# Patient Record
Sex: Female | Born: 1975 | Race: Black or African American | Hispanic: No | Marital: Single | Smoking: Never smoker
Health system: Southern US, Community
[De-identification: ages and names within clinical notes are randomized; demographics above are authoritative.]

## PROBLEM LIST (undated history)

## (undated) DIAGNOSIS — F419 Anxiety disorder, unspecified: Secondary | ICD-10-CM

## (undated) DIAGNOSIS — I10 Essential (primary) hypertension: Secondary | ICD-10-CM

## (undated) DIAGNOSIS — D649 Anemia, unspecified: Secondary | ICD-10-CM

## (undated) DIAGNOSIS — E785 Hyperlipidemia, unspecified: Secondary | ICD-10-CM

## (undated) DIAGNOSIS — K219 Gastro-esophageal reflux disease without esophagitis: Secondary | ICD-10-CM

## (undated) DIAGNOSIS — N920 Excessive and frequent menstruation with regular cycle: Secondary | ICD-10-CM

## (undated) HISTORY — DX: Anemia, unspecified: D64.9

## (undated) HISTORY — DX: Hyperlipidemia, unspecified: E78.5

## (undated) HISTORY — DX: Essential (primary) hypertension: I10

## (undated) HISTORY — DX: Excessive and frequent menstruation with regular cycle: N92.0

## (undated) HISTORY — DX: Anxiety disorder, unspecified: F41.9

## (undated) HISTORY — DX: Gastro-esophageal reflux disease without esophagitis: K21.9

---

## 2011-02-10 ENCOUNTER — Other Ambulatory Visit (HOSPITAL_COMMUNITY)
Admission: RE | Admit: 2011-02-10 | Discharge: 2011-02-10 | Disposition: A | Discharge status: Home / Self Care | Payer: Self-pay | Source: Ambulatory Visit | Attending: Obstetrics and Gynecology | Admitting: Obstetrics and Gynecology

## 2011-02-10 ENCOUNTER — Encounter: Payer: Self-pay | Admitting: Obstetrics and Gynecology

## 2011-02-10 ENCOUNTER — Ambulatory Visit (INDEPENDENT_AMBULATORY_CARE_PROVIDER_SITE_OTHER): Payer: Self-pay | Admitting: Obstetrics and Gynecology

## 2011-02-10 VITALS — BP 103/67 | HR 91 | Temp 99.9°F | Ht 67.0 in | Wt 255.2 lb

## 2011-02-10 DIAGNOSIS — N924 Excessive bleeding in the premenopausal period: Secondary | ICD-10-CM

## 2011-02-10 DIAGNOSIS — N920 Excessive and frequent menstruation with regular cycle: Secondary | ICD-10-CM | POA: Insufficient documentation

## 2011-02-10 MED ORDER — IBUPROFEN 200 MG PO TABS
800.0000 mg | ORAL_TABLET | Freq: Once | ORAL | Status: AC
  Administered 2011-02-10: 800 mg via ORAL

## 2011-02-10 NOTE — Patient Instructions (Signed)
Menorrhagia, Heavy Periods Dysfunctional uterine bleeding is different from a normal menstrual period. When periods are heavy or there is more bleeding than is usual for you, it is called menorrhagia. It may be caused by hormonal imbalance, or physical, metabolic, or other problems. Examination is necessary in order that your caregiver may treat treatable causes. If this is a continuing problem, a D&C may be needed. That means that the cervix (the opening of the uterus or womb) is dilated (stretched larger) and the lining of the uterus is scraped out. The tissue scraped out is then examined under a microscope by a specialist (pathologist) to make sure there is nothing of concern that needs further or more extensive treatment. HOME CARE INSTRUCTIONS  If medications were prescribed, take exactly as directed. Do not change or switch medications without consulting your caregiver.   Long term heavy bleeding may result in iron deficiency. Your caregiver may have prescribed iron pills. They help replace the iron your body lost from heavy bleeding. Take exactly as directed. Iron may cause constipation. If this becomes a problem, increase the bran, fruits, and roughage in your diet.   Do not take aspirin or medicines that contain aspirin one week before or during your menstrual period. Aspirin may make the bleeding worse.   If you need to change your sanitary pad or tampon more than once every 2 hours, stay in bed and rest as much as possible until the bleeding stops.   Eat well-balanced meals. Eat foods high in iron. Examples are leafy green vegetables, meat, liver, eggs, and whole grain breads and cereals. Do not try to lose weight until the abnormal bleeding has stopped and your blood iron level is back to normal.  SEEK MEDICAL CARE IF:  You need to change your sanitary pad or tampon more than once an hour.   You develop nausea (feeling sick to your stomach) and vomiting, dizziness, or diarrhea while you  are taking your medicine.   You have any problems that may be related to the medicine you are taking.  SEEK IMMEDIATE MEDICAL CARE IF:  An oral temperature above 100 develops.   You develop chills.   You develop severe bleeding or start to pass blood clots.   You feel dizzy or faint.  MAKE SURE YOU:   Understand these instructions.   Will watch your condition.   Will get help right away if you are not doing well or get worse.  Document Released: 06/19/2005 Document Re-Released: 06/01/2008 [facility] Patient Information 2011 [place], [place].

## 2011-02-10 NOTE — Progress Notes (Signed)
C/o of spotting July 9 until  17th when period started, this month started spotting 02/01/11 until present

## 2011-03-15 ENCOUNTER — Other Ambulatory Visit: Payer: Self-pay | Admitting: Obstetrics and Gynecology

## 2011-03-15 ENCOUNTER — Encounter: Payer: Self-pay | Admitting: Obstetrics and Gynecology

## 2011-03-15 ENCOUNTER — Ambulatory Visit (INDEPENDENT_AMBULATORY_CARE_PROVIDER_SITE_OTHER): Payer: Self-pay | Admitting: Obstetrics and Gynecology

## 2011-03-15 VITALS — BP 113/71 | HR 79 | Temp 98.4°F | Resp 20 | Ht 67.0 in | Wt 257.7 lb

## 2011-03-15 DIAGNOSIS — D649 Anemia, unspecified: Secondary | ICD-10-CM

## 2011-03-15 DIAGNOSIS — N938 Other specified abnormal uterine and vaginal bleeding: Secondary | ICD-10-CM

## 2011-03-15 DIAGNOSIS — N949 Unspecified condition associated with female genital organs and menstrual cycle: Secondary | ICD-10-CM

## 2011-03-15 MED ORDER — MEDROXYPROGESTERONE ACETATE 150 MG/ML IM SUSP
150.0000 mg | Freq: Once | INTRAMUSCULAR | Status: AC
  Administered 2011-03-15: 150 mg via INTRAMUSCULAR

## 2011-03-15 MED ORDER — MEDROXYPROGESTERONE ACETATE 150 MG/ML IM SUSP: 150.0000 mg | Freq: Once | INTRAMUSCULAR | Status: DC

## 2011-03-15 NOTE — Progress Notes (Signed)
Addended by: Faythe Casa on: 03/15/2011 04:28 PM   Modules accepted: Orders

## 2011-03-15 NOTE — Progress Notes (Signed)
Patient is a 35 year old African American female who is a diabetic and has hypertension for many years she's had very heavy periods and was recently seen here in had an endometrial biopsy which was normal. She was told by her family doctor for her anemia was close to needing transfusion. She was previously on oral contraceptives but spotted all the time and therefore stopped them. The patient is nulligravida and is not sure whether she never wants any children.  Plan: We've discussed her problem and decided since she's already taken iron that we will give her one dose of Depo-Provera in order to control her periods immediately. We'll have her fill out the papers for a [place] IUD, and also give her information about endometrial ablation. Hopefully the Depo-Provera work. Then will bring her back in 2 months to make a decision on whether to schedule her for an endometrial ablation or insert a [place] IUD. We'll also obtain a CBC today is that was not done her last visit and she will were starting from with her anemia. She seems to except the plan quite well. I also on a get in ultrasound of the pelvis to make sure there is no anatomical reason for the bleeding as this was not well established because of her size during the last visit.

## 2011-03-16 LAB — CBC WITH DIFFERENTIAL/PLATELET
Basophils Absolute: 0 10*3/uL (ref 0.0–0.1)
Basophils Relative: 0 % (ref 0–1)
Eosinophils Absolute: 0.1 10*3/uL (ref 0.0–0.7)
Eosinophils Relative: 1 % (ref 0–5)
MCH: 23.2 pg — ABNORMAL LOW (ref 26.0–34.0)
MCV: 77.9 fL — ABNORMAL LOW (ref 78.0–100.0)
Neutrophils Relative %: 49 % (ref 43–77)
Platelets: 493 10*3/uL — ABNORMAL HIGH (ref 150–400)
RDW: 18.1 % — ABNORMAL HIGH (ref 11.5–15.5)

## 2011-03-21 ENCOUNTER — Ambulatory Visit (HOSPITAL_COMMUNITY)
Admission: RE | Admit: 2011-03-21 | Discharge: 2011-03-21 | Disposition: A | Discharge status: Home / Self Care | Payer: Self-pay | Source: Ambulatory Visit | Attending: Obstetrics and Gynecology | Admitting: Obstetrics and Gynecology

## 2011-03-21 DIAGNOSIS — D250 Submucous leiomyoma of uterus: Secondary | ICD-10-CM | POA: Insufficient documentation

## 2011-03-21 DIAGNOSIS — D251 Intramural leiomyoma of uterus: Secondary | ICD-10-CM | POA: Insufficient documentation

## 2011-03-21 DIAGNOSIS — D649 Anemia, unspecified: Secondary | ICD-10-CM

## 2011-03-21 DIAGNOSIS — N938 Other specified abnormal uterine and vaginal bleeding: Secondary | ICD-10-CM | POA: Insufficient documentation

## 2011-03-21 DIAGNOSIS — N949 Unspecified condition associated with female genital organs and menstrual cycle: Secondary | ICD-10-CM | POA: Insufficient documentation

## 2011-03-27 ENCOUNTER — Encounter: Payer: Self-pay | Admitting: Obstetrics and Gynecology

## 2011-03-27 ENCOUNTER — Encounter: Payer: Self-pay | Admitting: Unknown Physician Specialty

## 2011-04-26 ENCOUNTER — Encounter: Payer: Self-pay | Admitting: Advanced Practice Midwife

## 2011-04-26 ENCOUNTER — Ambulatory Visit (INDEPENDENT_AMBULATORY_CARE_PROVIDER_SITE_OTHER): Payer: Self-pay | Admitting: Advanced Practice Midwife

## 2011-04-26 VITALS — BP 106/74 | HR 86 | Temp 98.2°F | Resp 20 | Ht 67.0 in | Wt 253.2 lb

## 2011-04-26 DIAGNOSIS — D259 Leiomyoma of uterus, unspecified: Secondary | ICD-10-CM

## 2011-04-26 DIAGNOSIS — D250 Submucous leiomyoma of uterus: Secondary | ICD-10-CM

## 2011-04-26 DIAGNOSIS — N920 Excessive and frequent menstruation with regular cycle: Secondary | ICD-10-CM

## 2011-04-26 NOTE — Patient Instructions (Signed)
Fibroids You have been diagnosed as having a fibroid. Fibroids are smooth muscle lumps (tumors) which can occur any place in a woman's body. They are usually in the womb (uterus). The most common problem (symptom) of fibroids is bleeding. Over time this may cause low red blood cells (anemia). Other symptoms include feelings of pressure and pain in the pelvis. The diagnosis (learning what is wrong) of fibroids is made by physical exam. Sometimes tests such as an ultrasound are used. This is helpful when fibroids are felt around the ovaries and to look for tumors. TREATMENT   Most fibroids do not need surgical or medical treatment. Sometimes a tissue sample (biopsy) of the lining of the uterus is done to rule out cancer. If there is no cancer and only a small amount of bleeding, the problem can be watched.   Hormonal treatment can improve the problem.   When surgery is needed, it can consist of removing the fibroid. Vaginal birth may not be possible after the removal of fibroids. This depends on where they are and the extent of surgery. When pregnancy occurs with fibroids it is usually normal.   Your caregiver can help decide which treatments are best for you.  HOME CARE INSTRUCTIONS   Do not use aspirin as this may increase bleeding problems.   If your periods (menses) are heavy, record the number of pads or tampons used per month. Bring this information to your caregiver. This can help them determine the best treatment for you.  SEEK IMMEDIATE MEDICAL CARE IF:  You have pelvic pain or cramps not controlled with medications, or experience a sudden increase in pain.   You have an increase of pelvic bleeding between and during menses.   You feel lightheaded or have fainting spells.   You develop worsening belly (abdominal) pain.  Document Released: 06/16/2000 Document Revised: 03/01/2011 Document Reviewed: 02/06/2008 [facility] Patient Information 2012 [place], [place].Menorrhagia Dysfunctional  uterine bleeding is different from a normal menstrual period. When periods are heavy or there is more bleeding than is usual for you, it is called menorrhagia. It may be caused by hormonal imbalance, or physical, metabolic, or other problems. Examination is necessary in order that your caregiver may treat treatable causes. If this is a continuing problem, a D&C may be needed. That means that the cervix (the opening of the uterus or womb) is dilated (stretched larger) and the lining of the uterus is scraped out. The tissue scraped out is then examined under a microscope by a specialist (pathologist) to make sure there is nothing of concern that needs further or more extensive treatment. HOME CARE INSTRUCTIONS   If medications were prescribed, take exactly as directed. Do not change or switch medications without consulting your caregiver.   Long term heavy bleeding may result in iron deficiency. Your caregiver may have prescribed iron pills. They help replace the iron your body lost from heavy bleeding. Take exactly as directed. Iron may cause constipation. If this becomes a problem, increase the bran, fruits, and roughage in your diet.   Do not take aspirin or medicines that contain aspirin one week before or during your menstrual period. Aspirin may make the bleeding worse.   If you need to change your sanitary pad or tampon more than once every 2 hours, stay in bed and rest as much as possible until the bleeding stops.   Eat well-balanced meals. Eat foods high in iron. Examples are leafy green vegetables, meat, liver, eggs, and whole grain breads and  cereals. Do not try to lose weight until the abnormal bleeding has stopped and your blood iron level is back to normal.  SEEK MEDICAL CARE IF:   You need to change your sanitary pad or tampon more than once an hour.   You develop nausea (feeling sick to your stomach) and vomiting, dizziness, or diarrhea while you are taking your medicine.   You have  any problems that may be related to the medicine you are taking.  SEEK IMMEDIATE MEDICAL CARE IF:   You have a fever.   You develop chills.   You develop severe bleeding or start to pass blood clots.   You feel dizzy or faint.  MAKE SURE YOU:   Understand these instructions.   Will watch your condition.   Will get help right away if you are not doing well or get worse.  Document Released: 06/19/2005 Document Revised: 03/01/2011 Document Reviewed: 02/07/2008 [facility] Patient Information 2012 [place], [place].

## 2011-04-26 NOTE — Progress Notes (Signed)
  Subjective:    Patient ID: Bridget Hunt, female    DOB: 10-04-1975, 35 y.o.   MRN: [id number]  HPI 35 yo G0 P0 who was seen by Dr Okey Dupre last month for a long history of heavy menstrual bleeding. She was started on DepoProvera to help with that while having an [place] and CBC done. Discussion was had regarding possible treatments including ablation and Mirena IUD.   She returns today for results.  Sisters have all had fibroids and one has had ablation, others had hysterectomy.  May want to have children at some point.  Review of Systems As above     Objective:   Physical Exam Deferred per patient and recent exam.   Hgb 8.7 US showed multiple uterine fibroids, largest at 2cm, some intramural. One is partially intramural and submucosal. Ovaries normal . Endometrium 3mm.     Assessment & Plan:  A:  Menorrhagia secondary to uterine fibroids  P:  Interested in [place] IUD.  Pamphlet given.  Long discussion of possible treatments with implications for childbearing and reversability. Pt wants to start with Mirena and move on if if does not work.  Will fill out application for Mirena assistance.

## 2011-12-06 ENCOUNTER — Encounter: Payer: Self-pay | Admitting: Obstetrics & Gynecology

## 2011-12-27 ENCOUNTER — Ambulatory Visit (INDEPENDENT_AMBULATORY_CARE_PROVIDER_SITE_OTHER): Payer: Self-pay | Admitting: Physician Assistant

## 2011-12-27 ENCOUNTER — Encounter: Payer: Self-pay | Admitting: Physician Assistant

## 2011-12-27 ENCOUNTER — Other Ambulatory Visit (HOSPITAL_COMMUNITY)
Admission: RE | Admit: 2011-12-27 | Discharge: 2011-12-27 | Disposition: A | Discharge status: Home / Self Care | Payer: Self-pay | Source: Ambulatory Visit | Attending: Physician Assistant | Admitting: Physician Assistant

## 2011-12-27 VITALS — BP 120/80 | HR 88 | Temp 97.2°F | Ht 66.0 in | Wt 261.6 lb

## 2011-12-27 DIAGNOSIS — R87612 Low grade squamous intraepithelial lesion on cytologic smear of cervix (LGSIL): Secondary | ICD-10-CM

## 2011-12-27 DIAGNOSIS — N870 Mild cervical dysplasia: Secondary | ICD-10-CM | POA: Insufficient documentation

## 2011-12-27 DIAGNOSIS — IMO0002 Reserved for concepts with insufficient information to code with codable children: Secondary | ICD-10-CM

## 2011-12-27 NOTE — Patient Instructions (Signed)
Colposcopy Colposcopy is a procedure that uses a special lighted microscope (colposcope). It examines your cervix and vagina, or the area around the outside of the vagina, for signs of disease or abnormalities in the cells. You may be sent to a specialist (gynecologist) to do the colposcopy. A biopsy (tissue sample) may be collected during a colposcopy, if the caregiver finds any unusual cells. The biopsy is sent to the lab for further testing, and the results are reported back to your caregiver. A WOMAN MAY NEED THIS PROCEDURE IF:  She has had an abnormal pap smear (taking cells from the cervix for testing).   She has a sore on her cervix, and a Pap test was normal.   The Pap test suggests human papilloma virus (HPV). This virus can cause genital warts and is linked to the development of cervical cancer.   She has genital warts on the cervix, or in or around the outside of the vagina.   Her mother took the drug DES while pregnant.   She has painful intercourse.   She has vaginal bleeding, especially after sexual intercourse.   There is a need to evaluate the results of previous treatment.  BEFORE THE PROCEDURE   Colposcopy is done when you are not having a menstrual period.   For 24 hours before the colposcopy, do not:   Douche.   Use tampons.   Use medicines, creams, or suppositories in the vagina.   Have sexual intercourse.  PROCEDURE   A colposcopy is done while a woman is lying on her back with her feet in foot rests (stirrups).   A speculum is placed inside the vagina to keep it open and to allow the caregiver to see the cervix. This is the same instrument used to do a pap smear.   The colposcope is placed outside the vagina. It is used to magnify and examine the cervix, vagina, and the area around the outside of the vagina.   A small amount of liquid solution is placed on the area that is to be viewed. This solution is placed on with a cotton applicator. This solution  makes it easier to see the abnormal cells.   Your caregiver will suck out mucus and cells from the canal of the cervix.   Small pieces of tissue for biopsy may be taken at the same time. You may feel mild pain or discomfort when this is done.   Your caregiver will record the location of the abnormal areas and send the tissue samples to a lab for analysis.   If your caregiver biopsies the vagina or outside of the vagina, a local anesthetic (novocaine) is usually given.  AFTER THE PROCEDURE   You may have some cramping that often goes away in a few minutes. You may have some soreness for a couple of days.   You may take over-the-counter pain medicine as advised by your caregiver. Do not take aspirin because it can cause bleeding.   Lie down for a few minutes if you feel lightheaded.   You may have some bleeding or dark discharge that should stop in a few days.   You may need to wear a sanitary pad for a few days.  HOME CARE INSTRUCTIONS   Avoid sex, douching, and using tampons for a week or as directed.   Only take medicine as directed by your caregiver.   Continue to take birth control pills, if you are on them.   Not all test results are   available during your visit. If your test results are not back during the visit, make an appointment with your caregiver to find out the results. Do not assume everything is normal if you have not heard from your caregiver or the medical facility. It is important for you to follow up on all of your test results.   Follow your caregiver's advice regarding medicines, activity, follow-up visits, and follow-up Pap tests.  SEEK MEDICAL CARE IF:   You develop a rash.   You have problems with your medicine.  SEEK IMMEDIATE MEDICAL CARE IF:  You are bleeding heavily or are passing blood clots.   You develop a fever over 102 F (38.9 C), with or without chills.   You have abnormal vaginal discharge.   You are having cramps that do not go away  after taking your pain medicine.   You feel lightheaded, dizzy, or faint.   You develop stomach pain.  Document Released: 09/09/2002 Document Revised: 06/08/2011 Document Reviewed: 04/22/2009 [employer] Patient Information 2012 [employer]. 

## 2011-12-27 NOTE — Progress Notes (Signed)
Pt presents as a referral for outside office secondary to  LSIL pap. Pt denies hx of abnormal paps prior to this year.Patient given informed consent, signed copy in the chart, time out was performed.  Placed in lithotomy position. Cervix viewed with speculum and colposcope after application of acetic acid.   Colposcopy adequate?  Yes Acetowhite lesions?Yes, .5cm lesion noted at 12 o'clock c/w HPV Punctation?No Mosaicism?  No Abnormal vasculature?  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  She will return in 2 weeks for results.

## 2012-01-15 ENCOUNTER — Telehealth: Payer: Self-pay | Admitting: General Practice

## 2012-01-15 NOTE — Telephone Encounter (Signed)
Called patient back, no answer left message.

## 2012-01-15 NOTE — Telephone Encounter (Signed)
Patient called stating she had a colposcopy a couple of weeks ago and no one has called her with the results yet. Patient would like a call back with the results. She also stated that someone said something to her about an appt around July 17 and wasn't sure if she was supposed to come back or not for that and was unsure if that was for a results visit or not. Patient does have an appt on July 24 at 2:15 for results-needs to be reminded about this,

## 2012-01-15 NOTE — Telephone Encounter (Signed)
Spoke with patient letting her know we received her message and that she has an appt on July 24 at 2:15 to go over her results with a provider. Patient verbalized understanding and stated she would be at the appt on the 24.

## 2012-01-24 ENCOUNTER — Encounter: Payer: Self-pay | Admitting: Physician Assistant

## 2012-01-24 ENCOUNTER — Ambulatory Visit (INDEPENDENT_AMBULATORY_CARE_PROVIDER_SITE_OTHER): Payer: Self-pay | Admitting: Physician Assistant

## 2012-01-24 VITALS — BP 123/73 | HR 86 | Ht 66.0 in | Wt 268.5 lb

## 2012-01-24 DIAGNOSIS — N870 Mild cervical dysplasia: Secondary | ICD-10-CM

## 2012-01-24 NOTE — Progress Notes (Signed)
Chief Complaint:  Results   Bridget Hunt is  36 y.o. G0P0000.  Patient's last menstrual period was 12/24/2011..   She presents complaining of Results  Obstetrical/Gynecological History: OB History    Grav Para Term Preterm Abortions TAB SAB Ect Mult Living   0 0 0 0 0 0 0 0 0 0       Past Medical History: Past Medical History  Diagnosis Date  . Hypertension   . Hyperlipidemia   . Menorrhagia   . Diabetes mellitus 2008    Type 2  . Anemia   . Anxiety   . GERD (gastroesophageal reflux disease)     Past Surgical History: No past surgical history on file.  Family History: Family History  Problem Relation Age of Onset  . Hypertension Mother   . Parkinsonism Mother   . GER disease Mother   . Diabetes type II Sister   . Hypertension Sister   . Diabetes type II Sister   . Hypertension Sister   . Fibroids Sister     Social History: History  Substance Use Topics  . Smoking status: Never Smoker   . Smokeless tobacco: Not on file  . Alcohol Use: 0.0 oz/week    0 Glasses of wine per week     socially    Allergies: No Known Allergies  Review of Systems - Negative   Physical Exam   Blood pressure 123/73, pulse 86, height 5\' 6"  (1.676 m), weight 268 lb 8 oz (121.791 kg), last menstrual period 12/24/2011.  General: General appearance - alert, well appearing, and in no distress, oriented to person, place, and time and overweight Mental status - alert, oriented to person, place, and time, normal mood, behavior, speech, dress, motor activity, and thought processes, affect appropriate to mood Focused Gynecological Exam: examination not indicated  Labs: Diagnosis 1. 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 - SEE COMMENT. 2. Endocervix, curettage - BENIGN ENDOCERVICAL MUCOSA. - THERE IS NO EVIDENCE OF MALIGNANCY.    Assessment: 1.  Dysplasia of cervix, low grade (CIN 1)     Plan: Reviewed above results. Recommend daily folic acid supplementation and weight loss.  RTC in 12 months for pap and co-testing or prn prob  Bridget Hunt E. 01/24/2012,2:25 PM

## 2012-01-24 NOTE — Patient Instructions (Signed)
HPV Test The HPV (Human papillomavirus) test isused to screen for high-risk types with HPV infection. HPV is a group of about 100 related viruses, of which 40 types are genital viruses. Most HPV viruses cause infections that usually resolve without treatment within 2 years. Some HPV infections can cause skin and genital warts (condylomata). HPV types 16, 18, 31 and 45 are considered high-risk types of HPV. High-risk types of HPV do not usually cause visible warts, but if untreated, may lead to cancers of the outlet of the womb (cervix) or anus. An HPV test identifies the DNA (genetic) strands of the HPV infection. Because the test identifies the DNA strands, the test is also referred to as the HPV DNA test. Although HPV is found in both males and females, the HPV test is only used to screen for cervical cancer in females. This test is recommended for females:  With an abnormal Pap test.   After treatment of an abnormal Pap test.   Aged 30 and older.   After treatment of a high-risk HPV infection.  The HPV test may be done at the same time as a Pap test in females over the age of 30. Both the HPV and Pap test require a sample of cells from the cervix. PREPARATION FOR TEST  You may be asked to avoid douching, tampons or birth canal (vaginal) medicines for 48 hours before the HPV test. You will be asked to urinate before the test. For the HPV test, you will need to lie on an exam table with your feet in stirrups. A spatula will be inserted into the vagina. The spatula will be used to swab the cervix for a cell and mucus sample. The sample will be further evaluated in a lab under a microscope. NORMAL FINDINGS  Normal: High-risk HPV is not found.  Ranges for normal findings may vary among different laboratories and hospitals. You should always check with your doctor after having lab work or other tests done to discuss the meaning of your test results and whether your values are considered within normal  limits. MEANING OF TEST An abnormal HPV test means that high-risk HPV is found. Your caregiver may recommend further testing. Your caregiver will go over the test results with you and discuss the importance and meaning of your results, as well as treatment options and the need for additional tests, if necessary. OBTAINING THE RESULTS  It is your responsibility to obtain your test results. Ask the lab or department performing the test when and how you will get your results. Document Released: 07/14/2004 Document Revised: 06/08/2011 Document Reviewed: 03/29/2005 [employer] Patient Information 2012 [employer]. 

## 2012-08-09 IMAGING — US US TRANSVAGINAL NON-OB
1 series · 13 of 25 positions shown · non-contrast
Comparison: None.

CLINICAL DATA: Dysfunctional uterine bleeding

TRANSABDOMINAL AND TRANSVAGINAL ULTRASOUND OF PELVIS
TECHNIQUE: Both transabdominal and transvaginal ultrasound
examinations of the pelvis were performed. Transabdominal technique
was performed for global imaging of the pelvis including uterus,
ovaries, adnexal regions, and pelvic cul-de-sac.

[Series 1: us pelvis complete · 13 of 88 slices shown]
[im 1/88]
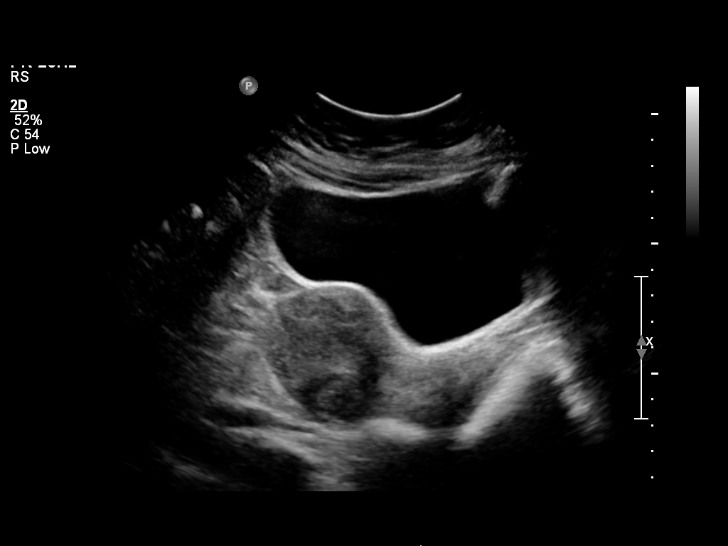
[im 8/88]
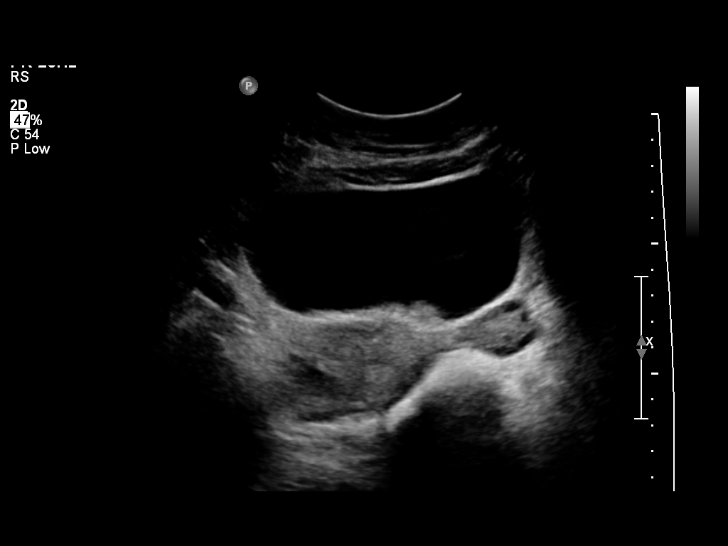
[im 15/88]
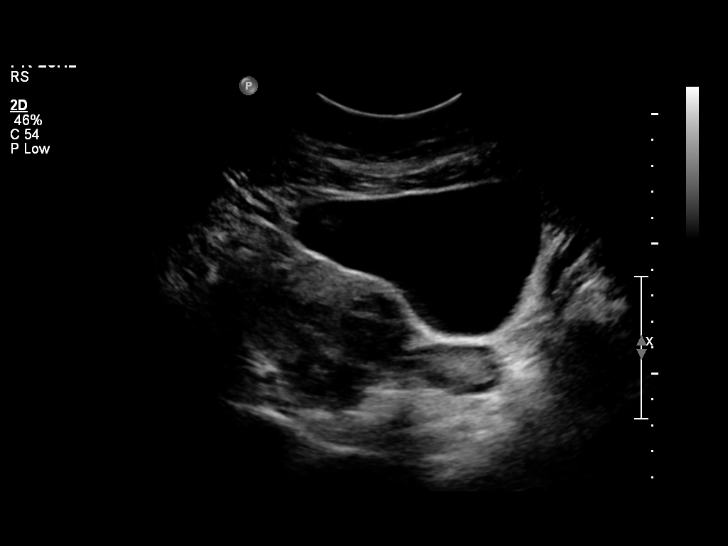
[im 22/88]
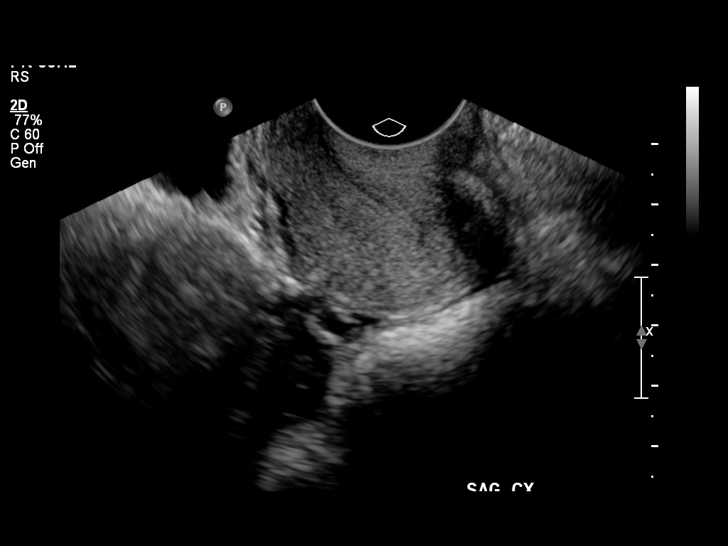
[im 30/88]
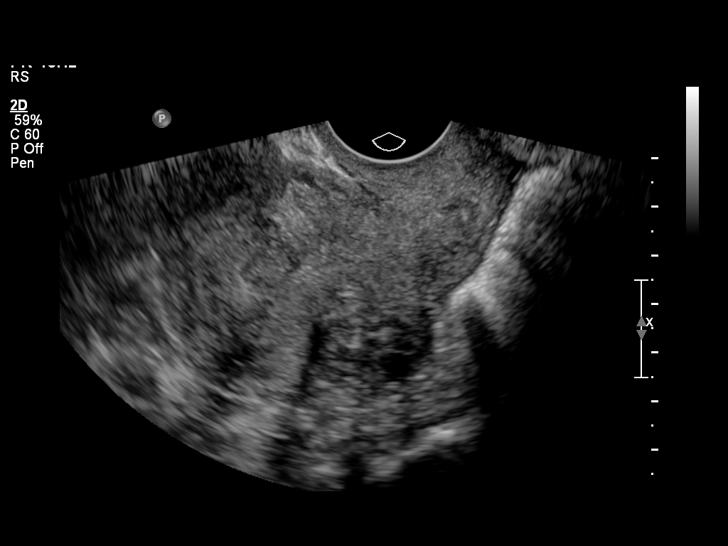
[im 37/88]
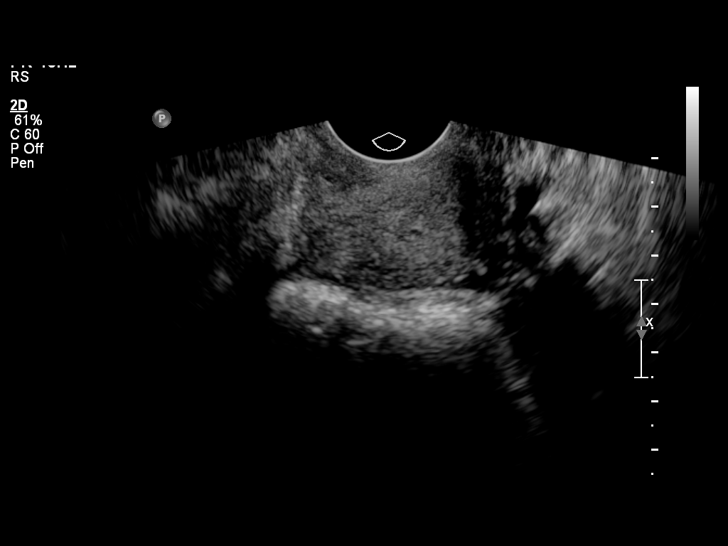
[im 44/88]
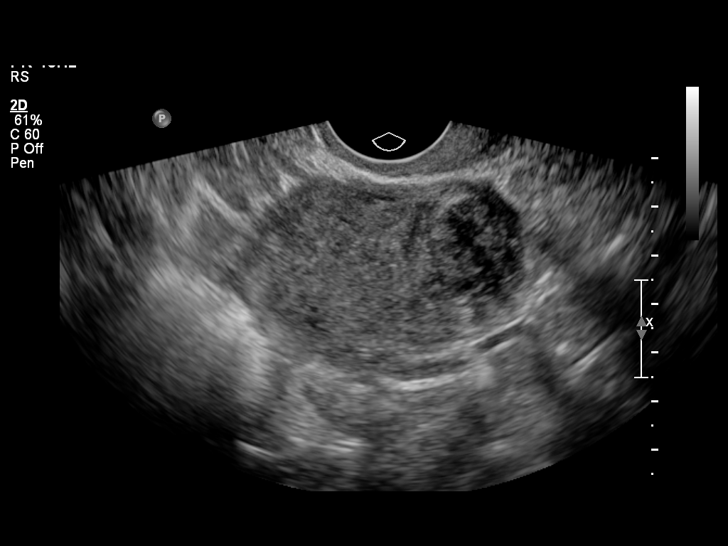
[im 51/88]
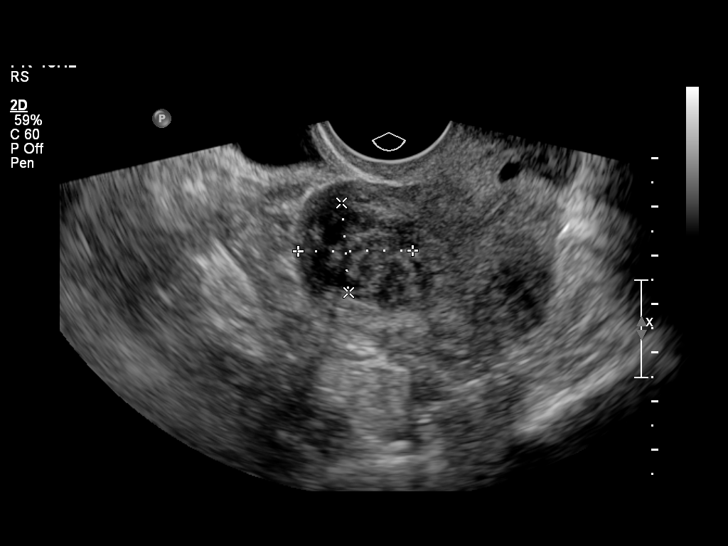
[im 59/88]
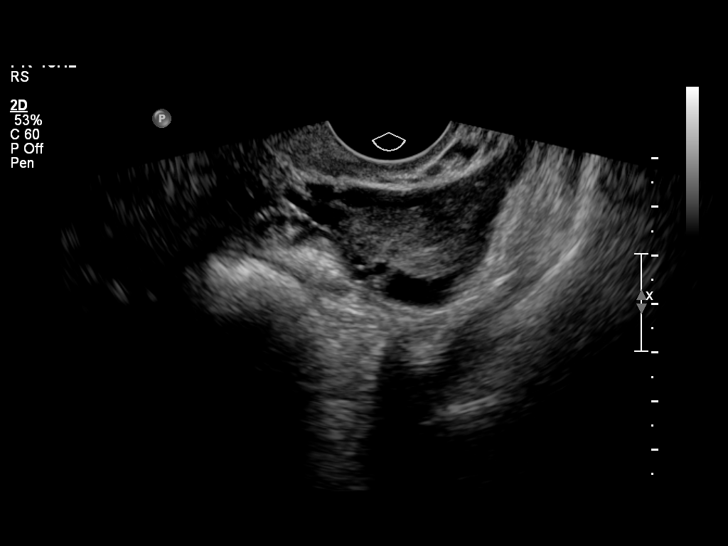
[im 66/88]
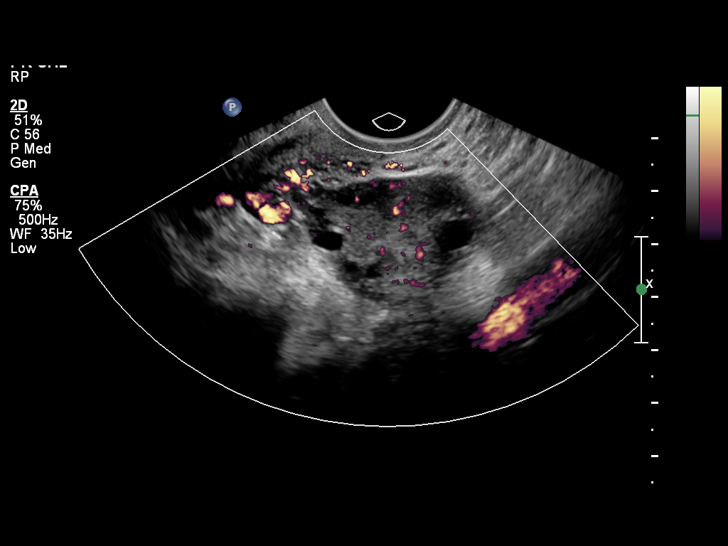
[im 73/88]
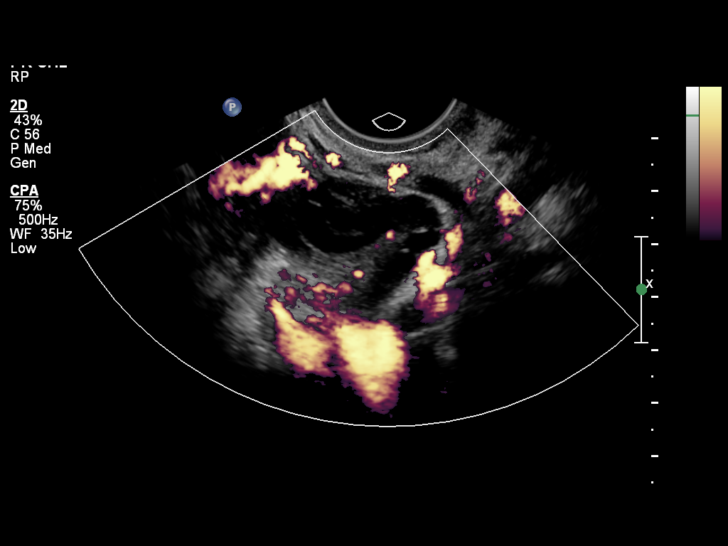
[im 80/88]
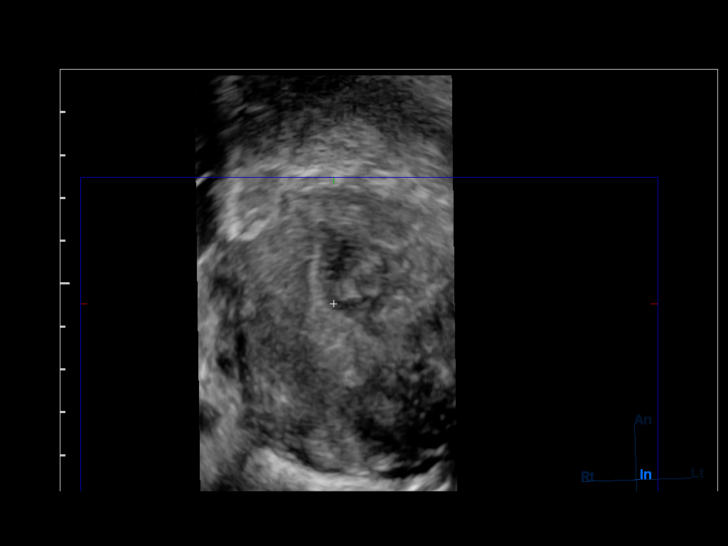
[im 88/88]
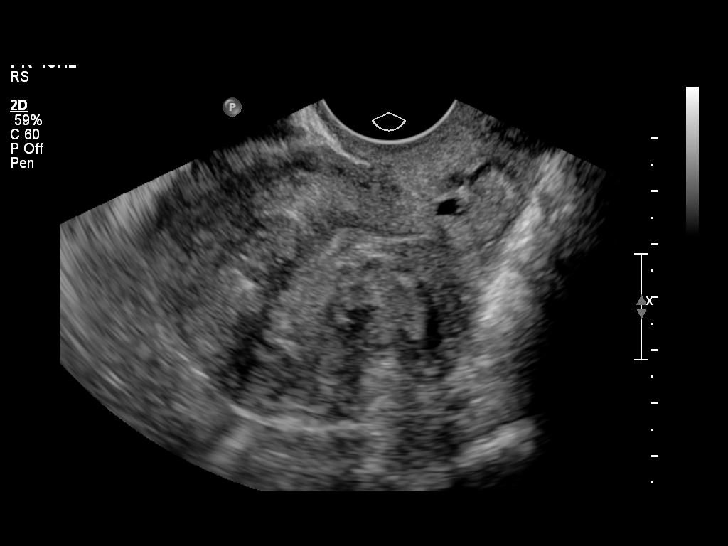

[13 of 25 positions shown; findings below may reference images not displayed]

It was necessary to proceed with endovaginal exam following the
transabdominal exam to visualize the endometrium, and right ovary
not seen transabdominally.
FINDINGS: Uterus: 8.8 x 6.2 x 5.3 cm.  Anteverted, anteflexed.  Multiple
fibroids noted, with largest measured as follows:

Left lateral uterine body, intramural, 2.4 x 2.0 x 1.8 cm

Posterior uterine body, intramural, 2.0 x 2.0 x 2.2 cm

Anterior uterine body, intramural / submucosal , 1.6 x 1.4 x 1.4 cm

Endometrium: 3 mm.  Uniformly thin and echogenic.

Right ovary:  4.0 x 2.7 x 2.0 cm.  Normal.

Left ovary: 3.2 x 2.5 x 2.0 cm.  Normal.

Other findings: Trace free fluid noted in the cul-de-sac.
IMPRESSION: Uterine fibroids as above.  One in the anterior uterine body has a
partial intramural / partial submucosal component, and could
contribute to the history of uterine bleeding. Pelvic MRI with
contrast may be helpful for precise localization of these fibroids
to determine whether hysteroscopic or transabdominal resection
approach is possible.

## 2015-04-15 DIAGNOSIS — D219 Benign neoplasm of connective and other soft tissue, unspecified: Secondary | ICD-10-CM | POA: Insufficient documentation

## 2015-04-15 DIAGNOSIS — D500 Iron deficiency anemia secondary to blood loss (chronic): Secondary | ICD-10-CM | POA: Insufficient documentation

## 2015-04-15 DIAGNOSIS — E876 Hypokalemia: Secondary | ICD-10-CM | POA: Insufficient documentation

## 2019-08-14 DIAGNOSIS — I639 Cerebral infarction, unspecified: Secondary | ICD-10-CM | POA: Insufficient documentation

## 2019-08-14 DIAGNOSIS — I10 Essential (primary) hypertension: Secondary | ICD-10-CM | POA: Insufficient documentation

## 2019-08-17 DIAGNOSIS — I608 Other nontraumatic subarachnoid hemorrhage: Secondary | ICD-10-CM | POA: Insufficient documentation

## 2019-08-17 DIAGNOSIS — I629 Nontraumatic intracranial hemorrhage, unspecified: Secondary | ICD-10-CM | POA: Insufficient documentation

## 2019-08-17 DIAGNOSIS — I67848 Other cerebrovascular vasospasm and vasoconstriction: Secondary | ICD-10-CM | POA: Insufficient documentation

## 2019-08-18 DIAGNOSIS — I724 Aneurysm of artery of lower extremity: Secondary | ICD-10-CM | POA: Insufficient documentation

## 2019-08-18 DIAGNOSIS — J9600 Acute respiratory failure, unspecified whether with hypoxia or hypercapnia: Secondary | ICD-10-CM | POA: Insufficient documentation

## 2019-09-29 DIAGNOSIS — G939 Disorder of brain, unspecified: Secondary | ICD-10-CM | POA: Insufficient documentation

## 2021-09-28 DIAGNOSIS — N6489 Other specified disorders of breast: Secondary | ICD-10-CM | POA: Insufficient documentation

## 2021-10-25 ENCOUNTER — Ambulatory Visit (INDEPENDENT_AMBULATORY_CARE_PROVIDER_SITE_OTHER): Payer: Medicaid Other | Admitting: Podiatry

## 2021-10-25 DIAGNOSIS — M79674 Pain in right toe(s): Secondary | ICD-10-CM | POA: Diagnosis not present

## 2021-10-25 DIAGNOSIS — M79675 Pain in left toe(s): Secondary | ICD-10-CM | POA: Diagnosis not present

## 2021-10-25 DIAGNOSIS — L600 Ingrowing nail: Secondary | ICD-10-CM | POA: Diagnosis not present

## 2021-10-25 DIAGNOSIS — G8220 Paraplegia, unspecified: Secondary | ICD-10-CM | POA: Diagnosis not present

## 2021-10-25 DIAGNOSIS — B351 Tinea unguium: Secondary | ICD-10-CM

## 2021-10-25 MED ORDER — MUPIROCIN 2 % EX OINT: 1.0000 "application " | TOPICAL_OINTMENT | Freq: Two times a day (BID) | CUTANEOUS | 2 refills | Status: DC

## 2021-10-25 NOTE — Patient Instructions (Signed)
Please do the following every day: ? ?Soak Instructions ? ? ? ?THE DAY AFTER THE PROCEDURE ? ?Place 1/4 cup of epsom salts (or betadine, or white vinegar) in a quart of warm tap water.  Submerge your foot or feet with outer bandage intact for the initial soak; this will allow the bandage to become moist and wet for easy lift off.  Once you remove your bandage, continue to soak in the solution for 20 minutes.  This soak should be done once a day.  Next, remove your foot or feet from solution, blot dry the affected area and apply the mupirocin ointment (prescription attached).  Then cover with band aid. Do this for 2 weeks then leave open to air ? ?IF YOUR SKIN BECOMES IRRITATED WHILE USING THESE INSTRUCTIONS, IT IS OKAY TO SWITCH TO  WHITE VINEGAR AND WATER. Or you may use antibacterial soap and water to keep the toe clean ? ?Monitor for any signs/symptoms of infection. Call the office immediately if any occur or go directly to the emergency room. Call with any questions/concerns. ? ?

## 2021-10-26 DIAGNOSIS — Z09 Encounter for follow-up examination after completed treatment for conditions other than malignant neoplasm: Secondary | ICD-10-CM | POA: Insufficient documentation

## 2021-10-28 NOTE — Progress Notes (Signed)
?  Subjective:  ?Patient ID: Bridget Hunt, female    DOB: April 25, 1976,  MRN: [id number] ? ?Chief Complaint  ?Patient presents with  ? Nail Problem  ?   np possible ingrown toenail/diabetic  ? ? ?46 y.o. female presents with the above complaint. History confirmed with patient.  She is here with her sister.  She has a history of stroke and paraplegia and is wheelchair-bound.  They are not able to cut her toenails.  The toenails become very painful especially the left great toe lateral border which has had some drainage and swelling ? ?Objective:  ?Physical Exam: ?warm, good capillary refill, no trophic changes or ulcerative lesions, normal DP and PT pulses, and normal sensory exam. ?Left Foot: dystrophic yellowed discolored nail plates with subungual debris and ingrowing great toenail lateral border ?Right Foot: dystrophic yellowed discolored nail plates with subungual debris and ingrowing great toenail lateral border ? ?Assessment:  ? ?1. Pain due to onychomycosis of toenails of both feet   ?2. Ingrown nail of great toe of left foot   ?3. Paraplegia ([place])   ? ? ? ?Plan:  ?Patient was evaluated and treated and all questions answered. ? ? ?Discussed the etiology and treatment options for the condition in detail with the patient. Educated patient on the topical and oral treatment options for mycotic nails. Recommended debridement of the nails today. Sharp and mechanical debridement performed of all painful and mycotic nails today. Nails debrided in length and thickness using a nail nipper to level of comfort. Discussed treatment options including appropriate shoe gear.  They will follow-up on a regular basis for intermittent nail debridements and I will have this scheduled for them ? ? ?Ingrown nail borders of the lateral great hallux bilateral was debrided in a slight back fashion to alleviate the offending border.  This was successful and I gave them post care instructions and prescribed mupirocin ointment to apply to  the nail fold daily.  I will see them back in 3 weeks for reevaluation to make sure he does not have any continued infection. ? ? ? ? ?Return in about 3 weeks (around 11/15/2021) for nail re-check.  ? ?

## 2021-11-21 ENCOUNTER — Ambulatory Visit: Payer: Medicaid Other | Admitting: Podiatry

## 2021-12-05 ENCOUNTER — Ambulatory Visit (INDEPENDENT_AMBULATORY_CARE_PROVIDER_SITE_OTHER): Payer: Medicaid Other | Admitting: Podiatry

## 2021-12-05 ENCOUNTER — Encounter: Payer: Self-pay | Admitting: Podiatry

## 2021-12-05 DIAGNOSIS — M79675 Pain in left toe(s): Secondary | ICD-10-CM

## 2021-12-05 DIAGNOSIS — M79674 Pain in right toe(s): Secondary | ICD-10-CM

## 2021-12-05 DIAGNOSIS — L600 Ingrowing nail: Secondary | ICD-10-CM

## 2021-12-05 DIAGNOSIS — G8220 Paraplegia, unspecified: Secondary | ICD-10-CM

## 2021-12-05 DIAGNOSIS — B351 Tinea unguium: Secondary | ICD-10-CM

## 2021-12-05 NOTE — Progress Notes (Signed)
  Subjective:  Patient ID: Bridget Hunt, female    DOB: [birth year],   MRN: [id number]  Chief Complaint  Patient presents with   Nail Problem    The two big toenails are feeling better and there is not any draining  and is sore and tender and I have not been soaking    46 y.o. female presents for follow-up of slant back procedure to left great toenail. Relates she is doing well and was soaking but discontinued. States they are doing better. She is wheel chair bound due to history of stroke and parapalegia.  . Denies any other pedal complaints. Denies n/v/f/c.   Past Medical History:  Diagnosis Date   Anemia    Anxiety    Diabetes mellitus 2008   Type 2   GERD (gastroesophageal reflux disease)    Hyperlipidemia    Hypertension    Menorrhagia     Objective:  Physical Exam: Vascular: DP/PT pulses 2/4 bilateral. CFT <3 seconds. Normal hair growth on digits. No edema.  Skin. No lacerations or abrasions bilateral feet. Bilateral hallux nails appear well healed and no areas of incurvation.  Musculoskeletal: MMT 5/5 bilateral lower extremities in DF, PF, Inversion and Eversion. Deceased ROM in DF of ankle joint.  Neurological: Sensation intact to light touch.   Assessment:   1. Ingrown nail of great toe of left foot   2. Pain due to onychomycosis of toenails of both feet   3. Paraplegia [facility])      Plan:  Patient was evaluated and treated and all questions answered. Toe was evaluated and appears to be healing well.  May discontinue soaks and neosporin.  Patient to follow-up in 2 months for rfc.   Bridget Hunt, DPM

## 2021-12-07 NOTE — Addendum Note (Signed)
Addended by: Cranford Mon R on: 12/07/2021 08:49 AM   Modules accepted: Orders

## 2022-01-27 ENCOUNTER — Ambulatory Visit: Payer: Medicaid Other | Admitting: Podiatry

## 2022-02-08 ENCOUNTER — Ambulatory Visit: Payer: Medicaid Other | Admitting: Podiatrist

## 2022-02-20 ENCOUNTER — Ambulatory Visit (INDEPENDENT_AMBULATORY_CARE_PROVIDER_SITE_OTHER): Payer: Medicare Other | Admitting: Podiatry

## 2022-02-20 ENCOUNTER — Encounter: Payer: Self-pay | Admitting: Podiatry

## 2022-02-20 DIAGNOSIS — M79674 Pain in right toe(s): Secondary | ICD-10-CM

## 2022-02-20 DIAGNOSIS — M79675 Pain in left toe(s): Secondary | ICD-10-CM | POA: Diagnosis not present

## 2022-02-20 DIAGNOSIS — G8220 Paraplegia, unspecified: Secondary | ICD-10-CM

## 2022-02-20 DIAGNOSIS — B351 Tinea unguium: Secondary | ICD-10-CM | POA: Diagnosis not present

## 2022-02-20 DIAGNOSIS — L600 Ingrowing nail: Secondary | ICD-10-CM | POA: Diagnosis not present

## 2022-02-20 NOTE — Progress Notes (Signed)
Subjective:  Patient ID: Bridget Hunt, female    DOB: [birth year],   MRN: [id number]  Chief Complaint  Patient presents with   Nail Problem      L great toe ingrown nail, nail trim    46 y.o. female presents for concern of thickened elongated and painful nails that are difficult to trim. Requesting to have them trimmed today. Also has concern for left great and left second toenails that are infected and feels they need to be trimmed out. She is wheel chair bound and comes from a facility. History of stroke and parapalegia.   PCP:  Maryella Shivers, MD    . Denies any other pedal complaints. Denies n/v/f/c.   Past Medical History:  Diagnosis Date   Anemia    Anxiety    Diabetes mellitus 2008   Type 2   GERD (gastroesophageal reflux disease)    Hyperlipidemia    Hypertension    Menorrhagia     Objective:  Physical Exam: Vascular: DP/PT pulses 2/4 bilateral. CFT <3 seconds. Normal hair growth on digits. No edema.  Skin. No lacerations or abrasions bilateral feet. Nails 1-5 bilateral are thickened and elongated. Left hallux medial border is incurvated with eyrhtema edema and purulence noted. Left second digit lateral border is incurvated with erythema edema and purulence.  Musculoskeletal: MMT 5/5 bilateral lower extremities in DF, PF, Inversion and Eversion. Deceased ROM in DF of ankle joint.  Neurological: Sensation intact to light touch.   Assessment:   1. Ingrown nail of great toe of left foot   2. Ingrown nail of second toe of left foot      Plan:  Patient was evaluated and treated and all questions answered. Patient requesting removal of ingrown nail today. Procedure below.  Remaining nails were debrided without incident with nail nippers.  Discussed procedure and post procedure care and patient expressed understanding.  Will follow-up in 2 weeks for nail check or sooner if any problems arise.    Procedure:  Procedure: partial Nail Avulsion of left hallux medial  nail border. Left second digit lateral nail border Surgeon: [name], DPM  Pre-op Dx: Ingrown toenail with infection Post-op: Same  Place of Surgery: Office exam room.  Indications for surgery: Painful and ingrown toenail.    The patient is requesting removal of nail with chemical matrixectomy. Risks and complications were discussed with the patient for which they understand and written consent was obtained. Under sterile conditions a total of 3 mL of  1% lidocaine plain was infiltrated in a hallux block fashion. Once anesthetized, the skin was prepped in sterile fashion. A tourniquet was then applied. Next the medial aspect of hallux nail border was then sharply excised making sure to remove the entire offending nail border.  Next phenol was then applied under standard conditions and copiously irrigated. Silvadene was applied. A dry sterile dressing was applied. After application of the dressing the tourniquet was removed and there is found to be an immediate capillary refill time to the digit. The procedure was repeated on the left second digit lateral border.  The patient tolerated the procedure well without any complications. Post procedure instructions were discussed the patient for which he verbally understood. Follow-up in two weeks for nail check or sooner if any problems are to arise. Discussed signs/symptoms of infection and directed to call the office immediately should any occur or go directly to the emergency room. In the meantime, encouraged to call the office with any questions, concerns, changes symptoms.  [name], DPM

## 2022-02-20 NOTE — Patient Instructions (Signed)

## 2022-03-15 ENCOUNTER — Ambulatory Visit (INDEPENDENT_AMBULATORY_CARE_PROVIDER_SITE_OTHER): Payer: Medicare Other | Admitting: Podiatry

## 2022-03-15 ENCOUNTER — Encounter: Payer: Self-pay | Admitting: Podiatry

## 2022-03-15 DIAGNOSIS — L600 Ingrowing nail: Secondary | ICD-10-CM

## 2022-03-15 NOTE — Progress Notes (Signed)
  Subjective:  Patient ID: Bridget Hunt, female    DOB: 1976/05/28,   MRN: [id number]  Chief Complaint  Patient presents with   Ingrown Toenail    Left hallux ingrown, nail check     46 y.o. female presents for follow-up of left ingrown nail procedure and second ingrown nail procedure. Relates she is doing well. They are still sore. Doing well despite facility not soaking toes or keeping covered.  Denies any other pedal complaints. Denies n/v/f/c.   Past Medical History:  Diagnosis Date   Anemia    Anxiety    Diabetes mellitus 2008   Type 2   GERD (gastroesophageal reflux disease)    Hyperlipidemia    Hypertension    Menorrhagia     Objective:  Physical Exam: Vascular: DP/PT pulses 2/4 bilateral. CFT <3 seconds. Normal hair growth on digits. No edema.  Skin. No lacerations or abrasions bilateral feet. Left hallux and second digit healing well. No erythema edema or purulence noted.  Musculoskeletal: MMT 5/5 bilateral lower extremities in DF, PF, Inversion and Eversion. Deceased ROM in DF of ankle joint.  Neurological: Sensation intact to light touch.   Assessment:   1. Ingrown nail of great toe of left foot   2. Ingrown nail of second toe of left foot      Plan:  Patient was evaluated and treated and all questions answered. Toe was evaluated and appears to be healing well.  May discontinue soaks and neosporin.  Patient to follow-up as needed.    [name], DPM

## 2022-09-26 ENCOUNTER — Ambulatory Visit (INDEPENDENT_AMBULATORY_CARE_PROVIDER_SITE_OTHER): Payer: Medicare Other | Admitting: Podiatry

## 2022-09-26 DIAGNOSIS — Z91199 Patient's noncompliance with other medical treatment and regimen due to unspecified reason: Secondary | ICD-10-CM

## 2022-09-26 NOTE — Progress Notes (Signed)
Pt was a no show for apt, charge generated 

## 2022-11-03 ENCOUNTER — Ambulatory Visit (INDEPENDENT_AMBULATORY_CARE_PROVIDER_SITE_OTHER): Payer: Medicare Other | Admitting: Podiatry

## 2022-11-03 DIAGNOSIS — L600 Ingrowing nail: Secondary | ICD-10-CM | POA: Diagnosis not present

## 2022-11-03 MED ORDER — CEPHALEXIN 500 MG PO CAPS: 500.0000 mg | ORAL_CAPSULE | Freq: Three times a day (TID) | ORAL | 0 refills | Status: AC

## 2022-11-03 NOTE — Addendum Note (Signed)
Addended by: Carlena Hurl F on: 11/03/2022 01:01 PM   Modules accepted: Orders

## 2022-11-03 NOTE — Patient Instructions (Signed)

## 2022-11-03 NOTE — Progress Notes (Signed)
  Subjective:  Patient ID: Bridget Hunt, female    DOB: [birth year],  MRN: [id number]  Chief Complaint  Patient presents with   Ingrown Toenail    Ingrown to left hallux- medial border. Patient is diabetic.     47 y.o. female presents with concern for pain at the medial border of the left hallux nail.  Patient has a history of type 2 diabetes.  She has previously had this ingrown removed in the past.  But has come back.  She has drainage and swelling.  Of note patient is unable to ambulate and is on stretcher.  Past Medical History:  Diagnosis Date   Anemia    Anxiety    Diabetes mellitus 2008   Type 2   GERD (gastroesophageal reflux disease)    Hyperlipidemia    Hypertension    Menorrhagia     No Known Allergies  ROS: Negative except as per HPI above  Objective:  General: AAO x3, NAD  Dermatological: Incurvation is present along the medial nail border of the left great toe. There is localized edema without any erythema or increase in warmth around the nail border. There is no drainage or pus. There is no ascending cellulitis. No malodor. No open lesions or pre-ulcerative lesions.   Vascular:  Dorsalis Pedis artery and Posterior Tibial artery pedal pulses are 2/4 bilateral.  Capillary fill time < 3 sec to all digits.   Neruologic: Grossly intact via light touch bilateral. Protective threshold intact to all sites bilateral.   Musculoskeletal: No gross boney pedal deformities bilateral. No pain, crepitus, or limitation noted with foot and ankle range of motion bilateral. Muscular strength 5/5 in all groups tested bilateral.  Gait: Unassisted, Nonantalgic.   No images are attached to the encounter.  Assessment:   1. Ingrown nail of great toe of left foot      Plan:  Patient was evaluated and treated and all questions answered.  Ingrown Nail, left -Patient elects to proceed with minor surgery to remove ingrown toenail today. Consent reviewed and signed by  patient. -Ingrown nail excised. See procedure note. -Educated on post-procedure care including soaking. Written instructions provided and reviewed. -Patient to follow up in 2 weeks for nail check.  Procedure: Excision of Ingrown Toenail Location: Left 1st toe medial nail borders. Anesthesia: Lidocaine 1% plain; 1.5 mL and Marcaine 0.5% plain; 1.5 mL, digital block. Skin Prep: Betadine. Dressing: Silvadene; telfa; dry, sterile, compression dressing. Technique: Following skin prep, the toe was exsanguinated and a tourniquet was secured at the base of the toe. The affected nail border was freed, split with a nail splitter, and excised. Chemical matrixectomy was then performed with phenol and irrigated out with alcohol. The tourniquet was then removed and sterile dressing applied. Disposition: Patient tolerated procedure well. Patient to return in 2 weeks for follow-up.    Return if symptoms worsen or fail to improve.          Corinna Gab, DPM [facility] / [facility]

## 2024-04-10 ENCOUNTER — Telehealth: Payer: Self-pay | Admitting: Diagnostic Neuroimaging

## 2024-04-10 NOTE — Telephone Encounter (Signed)
 Pt facility called stating that transportation company cant bring Pt in at that time they open at 9:30  is it ok for PT to be seen later on that day ,Informed I will give PT Transportation company a call back with answer PT APPT IS  10 /13/9:30 Dr. SHAUNNA PT

## 2024-04-14 ENCOUNTER — Ambulatory Visit (INDEPENDENT_AMBULATORY_CARE_PROVIDER_SITE_OTHER): Admitting: Diagnostic Neuroimaging

## 2024-04-14 ENCOUNTER — Ambulatory Visit: Admitting: Diagnostic Neuroimaging

## 2024-04-14 ENCOUNTER — Encounter: Payer: Self-pay | Admitting: Diagnostic Neuroimaging

## 2024-04-14 VITALS — BP 154/103 | HR 107

## 2024-04-14 DIAGNOSIS — G6281 Critical illness polyneuropathy: Secondary | ICD-10-CM | POA: Diagnosis not present

## 2024-04-14 DIAGNOSIS — I69359 Hemiplegia and hemiparesis following cerebral infarction affecting unspecified side: Secondary | ICD-10-CM

## 2024-04-14 NOTE — Progress Notes (Signed)
 [facility]  PATIENT: Bridget Hunt DOB: 02/22/1976  REFERRING CLINICIAN: Burt Fus, DPM HISTORY FROM: patient and sister REASON FOR VISIT: new consult   HISTORICAL  CHIEF COMPLAINT:  Chief Complaint  Patient presents with   Gait Problem    Rm 7with sister and EMS Pt is well,  she is experiencing atrophy and limited range of motion in BLE.     HISTORY OF PRESENT ILLNESS:   48 year old female with history of stroke here for evaluation of generalized weakness.  History of hypertension, diabetes, presented to hospital in February 2021 due to seizure and unresponsiveness.  She was found to have bilateral basal ganglia infarctions initially.  A few days later she developed a left frontal intraparenchymal hemorrhage.  She was found to have multifocal stenoses and vasospasm.  Vasculitis was considered and she was started on high-dose steroids.  Brain biopsy was performed and rheumatology consultation obtained.  Spinal tap analysis and brain biopsy were not consistent with vasculitis.  Unfortunately she did not improve with high-dose steroids.  PEG tube and tracheostomy were placed.  Hospital course was complicated by DVT, pneumonia and general physical debility.  Ultimately she was transferred to rehabilitation facility.  At that time she had significant aphasia, severe upper and lower extremity weakness. She has been at [facility] since that time.    Patient has continued to receive some physical therapy and Occupational Therapy services since that time.  Sister is asking about long-term prognosis and to see if anything else can be done to improve her status.  Patient was referred here by podiatry to see if she could be candidate for any devices to help her walk better.   REVIEW OF SYSTEMS: Full 14 system review of systems performed and negative with exception of: as per HPI.  ALLERGIES: No Known Allergies  HOME MEDICATIONS: Outpatient Medications  Prior to Visit  Medication Sig Dispense Refill   amantadine (SYMMETREL) 50 MG/5ML solution Take by mouth.     aspirin EC 81 MG tablet Take 81 mg by mouth daily. Swallow whole.     famotidine (PEPCID) 20 MG tablet Take 20 mg by mouth 2 (two) times daily.     gabapentin (NEURONTIN) 300 MG capsule Take 300 mg by mouth daily.     gabapentin (NEURONTIN) 600 MG tablet Take 600 mg by mouth daily.     GUAIFENESIN PO Take by mouth.     ibuprofen  (ADVIL ) 600 MG tablet Take 600 mg by mouth every 6 (six) hours as needed.     levETIRAcetam (KEPPRA) 100 MG/ML solution SMARTSIG:Milliliter(s) By Mouth     Melatonin 10 MG CAPS Take 1 capsule by mouth at bedtime.     metFORMIN (GLUCOPHAGE-XR) 750 MG 24 hr tablet Take 750 mg by mouth 2 (two) times daily.     oxyCODONE-acetaminophen (PERCOCET/ROXICET) 5-325 MG tablet Take 1 tablet by mouth 2 (two) times daily as needed.     sennosides (SENOKOT) 8.8 MG/5ML syrup Take by mouth.     THERATEARS NIGHTTIME 1 % GEL Apply to eye.     Acetaminophen (TYLENOL EXTRA STRENGTH PO) Take by mouth. (Patient not taking: Reported on 04/14/2024)     atorvastatin (LIPITOR) 40 MG tablet Take 40 mg by mouth daily. (Patient not taking: Reported on 04/14/2024)     iron polysaccharides (NIFEREX) 150 MG capsule Take 150 mg by mouth 2 (two) times daily.   (Patient not taking: Reported on 04/14/2024)     losartan (COZAAR) 100 MG tablet 1 tablet (100  mg total) by Per G Tube route daily. (Patient not taking: Reported on 04/14/2024)     mupirocin  ointment (BACTROBAN ) 2 % Apply 1 application. topically 2 (two) times daily. (Patient not taking: Reported on 04/14/2024) 30 g 2   traMADol (ULTRAM) 50 MG tablet Take 50 mg by mouth 2 (two) times daily as needed. (Patient not taking: Reported on 04/14/2024)     No facility-administered medications prior to visit.    PAST MEDICAL HISTORY: Past Medical History:  Diagnosis Date   Anemia    Anxiety    Diabetes mellitus 2008   Type 2   GERD  (gastroesophageal reflux disease)    Hyperlipidemia    Hypertension    Menorrhagia     PAST SURGICAL HISTORY: History reviewed. No pertinent surgical history.  FAMILY HISTORY: Family History  Problem Relation Age of Onset   Hypertension Mother    Parkinsonism Mother    GER disease Mother    Diabetes type II Sister    Hypertension Sister    Diabetes type II Sister    Hypertension Sister    Fibroids Sister     SOCIAL HISTORY: Social History   Socioeconomic History   Marital status: Single    Spouse name: Not on file   Number of children: Not on file   Years of education: Not on file   Highest education level: Not on file  Occupational History   Not on file  Tobacco Use   Smoking status: Never   Smokeless tobacco: Not on file  Substance and Sexual Activity   Alcohol use: Yes    Alcohol/week: 0.0 standard drinks of alcohol    Comment: socially   Drug use: No   Sexual activity: Yes    Birth control/protection: Injection  Other Topics Concern   Not on file  Social History Narrative   Not on file   Social Drivers of Health   Financial Resource Strain: Not on file  Food Insecurity: Not on file  Transportation Needs: Not on file  Physical Activity: Not on file  Stress: Not on file  Social Connections: Not on file  Intimate Partner Violence: Not on file     PHYSICAL EXAM  GENERAL EXAM/CONSTITUTIONAL: Vitals:  Vitals:   04/14/24 1113  BP: (!) 154/103  Pulse: (!) 107   There is no height or weight on file to calculate BMI. Wt Readings from Last 3 Encounters:  01/24/12 268 lb 8 oz (121.8 kg)  12/27/11 261 lb 9.6 oz (118.7 kg)  04/26/11 253 lb 3.2 oz (114.9 kg)   Patient is in no distress; well developed, nourished and groomed; neck is supple  CARDIOVASCULAR: Examination of carotid arteries is normal; no carotid bruits Regular rate and rhythm, no murmurs Examination of peripheral vascular system by observation and palpation is  normal  EYES: Ophthalmoscopic exam of optic discs and posterior segments is normal; no papilledema or hemorrhages No results found.  MUSCULOSKELETAL: Gait, strength, tone, movements noted in Neurologic exam below  NEUROLOGIC: MENTAL STATUS:      No data to display         awake, alert, oriented to person, 2025, October, Monday recent and remote memory intact normal attention and concentration language fluent, comprehension intact, naming intact fund of knowledge appropriate  CRANIAL NERVE:  2nd, 3rd, 4th, 6th - pupils equal and reactive to light, visual fields full to confrontation, extraocular muscles intact, no nystagmus 5th - facial sensation symmetric 7th - facial strength --> DECR EYEBROW RAISE ON LEFT 8th -  hearing intact 9th - palate elevates symmetrically, uvula midline 11th - shoulder shrug symmetric 12th - tongue protrusion midline  MOTOR:  BUE --> CONTRACTED AT WRISTS AND FINGER; RUE 1-2 PROX, 0 DISTAL; LUE 2-3 PROX, 2 DISTAL ATROPHY IN BLE; BLE 0-1 flicker of movement proximally; 0 distally  SENSORY:  normal and symmetric to light touch, temperature, vibration  COORDINATION:  finger-nose-finger, fine finger movements LIMITED BY WEAKNESS  REFLEXES:  deep tendon reflexes TRACE and symmetric; ABSENT IN BLE  GAIT/STATION:  IN TRANSPORT STRETCHER     DIAGNOSTIC DATA (LABS, IMAGING, TESTING) - I reviewed patient records, labs, notes, testing and imaging myself where available.  Lab Results  Component Value Date   WBC 7.1 03/15/2011   HGB 8.7 (L) 03/15/2011   HCT 29.2 (L) 03/15/2011   MCV 77.9 (L) 03/15/2011   PLT 493 (H) 03/15/2011   No results found for: NA, K, CL, CO2, GLUCOSE, BUN, CREATININE, CALCIUM, PROT, ALBUMIN, AST, ALT, ALKPHOS, BILITOT, GFRNONAA, GFRAA No results found for: CHOL, HDL, LDLCALC, LDLDIRECT, TRIG, CHOLHDL No results found for: YHAJ8R No results found for: VITAMINB12 No  results found for: TSH   08/14/19 MRI brain 1. Unusual pattern of confluent bilateral basal ganglia and external  white matter capsule acute infarcts.  Absence of posterior fossa or other hemispheric involvement argues  against a recent embolic event.  No associated hemorrhage or mass effect.  2. Essentially negative MRI appearance of the brain otherwise;  partially empty sella is noted which can be a normal anatomic  variant or can be associated with idiopathic intracranial  hypertension (pseudotumor cerebri).   08/17/19 MRI brain 1. New 3 cm left frontal lobe parenchymal hemorrhage. Given the  location and other recent infarcts, this may reflect a hemorrhagic  left ACA infarct. A venous infarct could also have this appearance,  however the adjacent superior sagittal sinus is patent.  2. Associated trace extra-axial hemorrhage anteriorly along the  falx.  3. No large vessel occlusion.  4. Similar appearance of multifocal intracranial arterial stenoses  which could reflect vasculitis, premature atherosclerosis, or other  cerebral vasculopathy.  5. Widely patent cervical carotid and vertebral arteries.    09/08/19 MRI brain  1.  Expected interval evolution of prior multifocal infarcts, as described above.  2.  No evidence of new infarct.  3.  Similar size of the intraparenchymal hematoma within the left frontal lobe with minimal associated surrounding vasogenic edema.    ASSESSMENT AND PLAN  48 y.o. 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  Has had some improvement in language and left upper extremity movement.  Unfortunately her lower extremities have continued to remain significantly weak.  Reviewed diagnosis, prognosis and treatment options.  Unfortunately there is low probability that patient will ever be able to walk  independently in the future.  However encouraged patient and sister to continue with physical therapy evaluations and exercises as recommended by rehab facility.  Dx:  1. Hemiplegia, post-stroke (HCC)   2. Critical illness neuropathy     PLAN:  POST-STROKE WEAKNESS (since Feb 2021) + critical illness myopathy / neuropathy + debility / deconditioning - continue PT / OT; consider PM&R consult for botox for post-stroke spasticity  Return for pending if symptoms worsen or fail to improve, return to PCP.    EDUARD FABIENE HANLON, MD 04/14/2024, 12:33 PM Certified in Neurology, Neurophysiology and Neuroimaging  [facility]  [address] [place], [place] [address] [phone]

## 2024-04-14 NOTE — Patient Instructions (Signed)
  POST-STROKE WEAKNESS (since Feb 2021) + critical illness myopathy / neuropathy + debility / deconditioning - continue PT / OT; consider PM&R consult for botox for post-stroke spasticity
